# Patient Record
Sex: Male | Born: 2000 | Race: White | Hispanic: No | Marital: Single | Smoking: Never smoker
Health system: Southern US, Community
[De-identification: ages and names within clinical notes are randomized; demographics above are authoritative.]

---
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
## 2020-02-20 ENCOUNTER — Other Ambulatory Visit: Payer: Self-pay | Admitting: Critical Care Medicine

## 2020-02-20 ENCOUNTER — Other Ambulatory Visit: Payer: BC Managed Care – PPO

## 2020-02-20 DIAGNOSIS — Z20822 Contact with and (suspected) exposure to covid-19: Secondary | ICD-10-CM

## 2020-02-21 LAB — SARS-COV-2, NAA 2 DAY TAT

## 2020-02-21 LAB — NOVEL CORONAVIRUS, NAA: SARS-CoV-2, NAA: NOT DETECTED

## 2021-09-21 ENCOUNTER — Other Ambulatory Visit (HOSPITAL_COMMUNITY): Payer: Self-pay | Admitting: Pediatric Gastroenterology

## 2021-09-21 ENCOUNTER — Other Ambulatory Visit: Payer: Self-pay | Admitting: Pediatric Gastroenterology

## 2021-09-21 DIAGNOSIS — K50919 Crohn's disease, unspecified, with unspecified complications: Secondary | ICD-10-CM

## 2021-09-29 ENCOUNTER — Encounter (HOSPITAL_COMMUNITY): Payer: Self-pay

## 2021-09-29 ENCOUNTER — Other Ambulatory Visit: Payer: Self-pay

## 2021-09-29 ENCOUNTER — Ambulatory Visit (HOSPITAL_COMMUNITY)
Admission: RE | Admit: 2021-09-29 | Discharge: 2021-09-29 | Disposition: A | Discharge status: Home / Self Care | Payer: BC Managed Care – PPO | Source: Ambulatory Visit | Attending: Pediatric Gastroenterology | Admitting: Pediatric Gastroenterology

## 2021-09-29 DIAGNOSIS — K50919 Crohn's disease, unspecified, with unspecified complications: Secondary | ICD-10-CM | POA: Diagnosis not present

## 2021-09-29 MED ORDER — BARIUM SULFATE 0.1 % PO SUSP
ORAL | Status: AC
  Filled 2021-09-29: qty 3

## 2021-09-29 MED ORDER — BARIUM SULFATE 0.1 % PO SUSP
450.0000 mL | Freq: Once | ORAL | Status: AC
  Administered 2021-09-29: 450 mL via ORAL

## 2021-09-29 MED ORDER — SODIUM CHLORIDE (PF) 0.9 % IJ SOLN
INTRAMUSCULAR | Status: AC
  Filled 2021-09-29: qty 50

## 2021-09-29 MED ORDER — IOHEXOL 300 MG/ML  SOLN
100.0000 mL | Freq: Once | INTRAMUSCULAR | Status: AC | PRN
  Administered 2021-09-29: 100 mL via INTRAVENOUS

## 2022-09-27 IMAGING — CT CT ENTEROGRAPHY (ABD-PELV W/ CM)
2 of 5 series · 17 of 46 positions shown, 19 images · IV contrast (agent unspecified)
Comparison: None.

CLINICAL DATA: Crohn's disease with complication

EXAM:
CT ABDOMEN AND PELVIS WITH CONTRAST (ENTEROGRAPHY)
TECHNIQUE: Multidetector CT of the abdomen and pelvis during bolus
administration of intravenous contrast. Negative oral contrast was
given.

[Series 3: entero thins · axial · 0.82mm/px · z∈[+1108,+1486]mm · 14 of 213 slices shown, 16 images]
[im 12/213  soft-tissue]
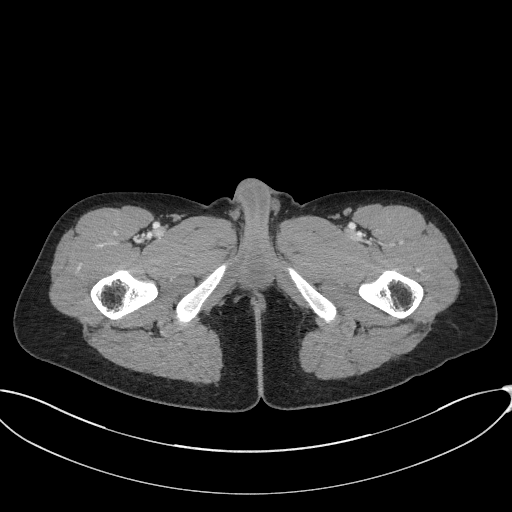
[im 12/213  bone]
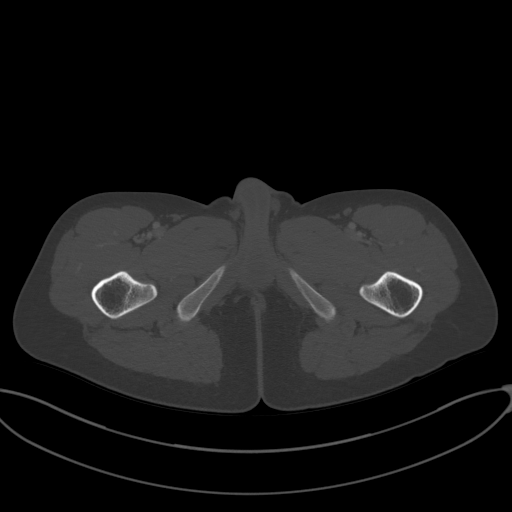
[im 24/213  soft-tissue]
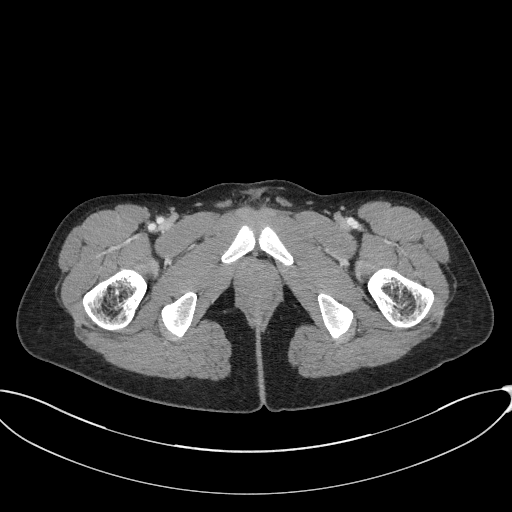
[im 48/213  soft-tissue]
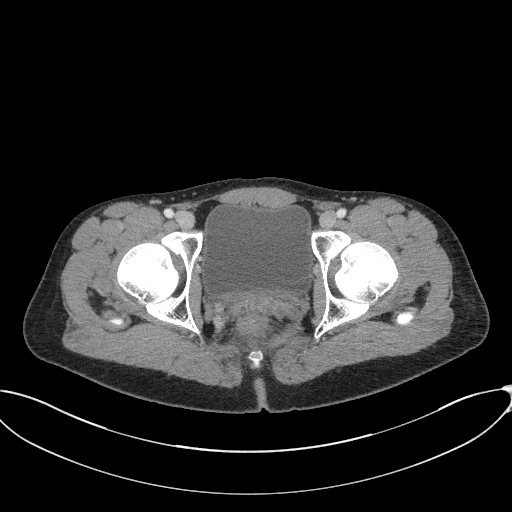
[im 59/213  soft-tissue]
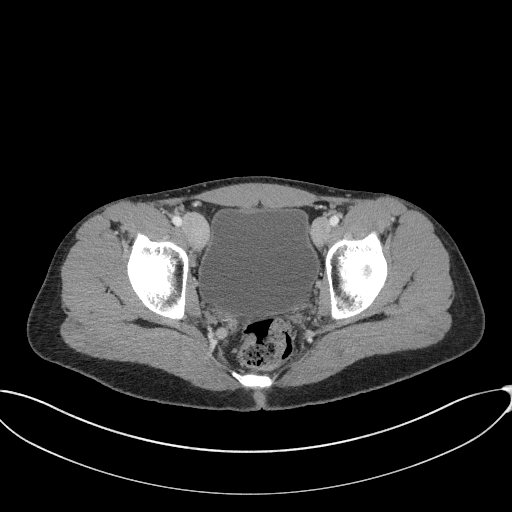
[im 71/213  soft-tissue]
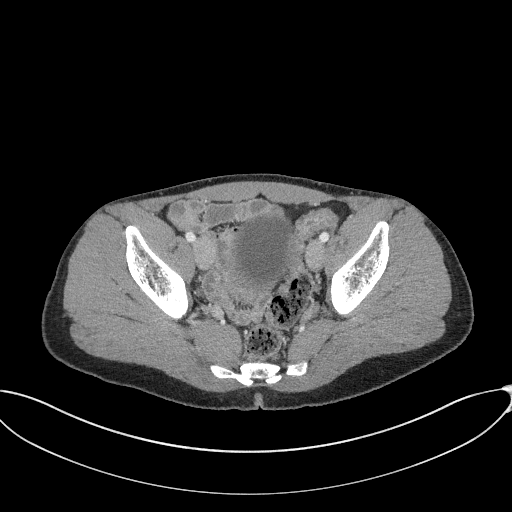
[im 83/213  soft-tissue]
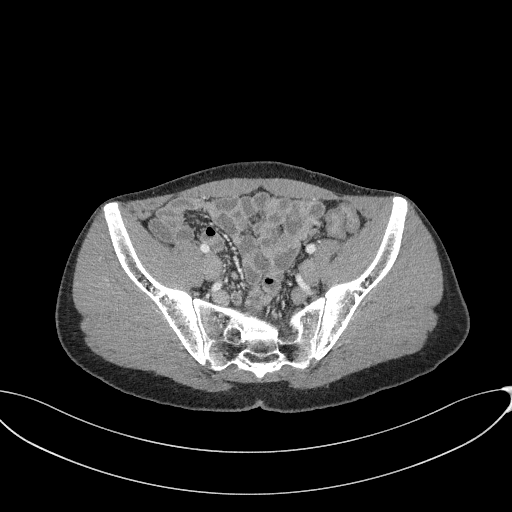
[im 95/213  soft-tissue]
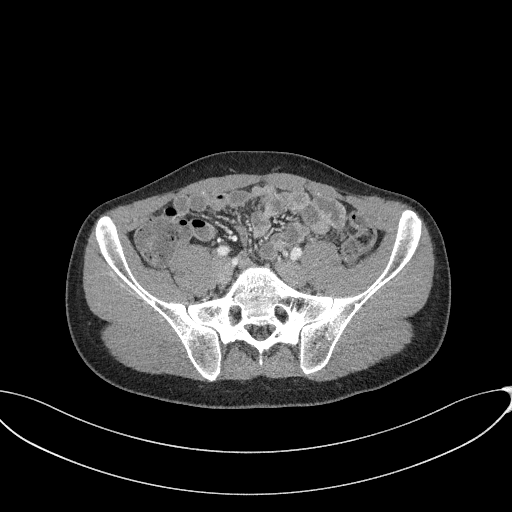
[im 118/213  soft-tissue]
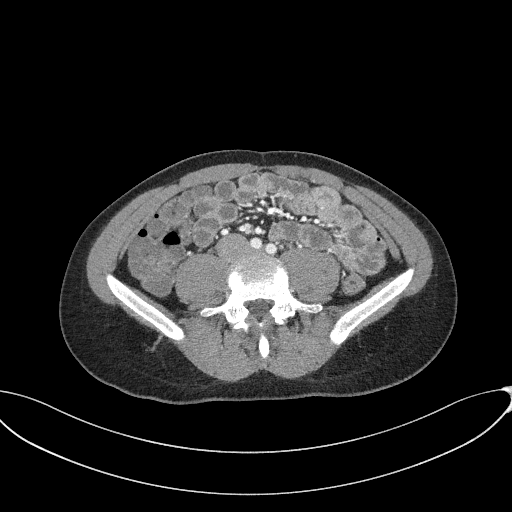
[im 130/213  soft-tissue]
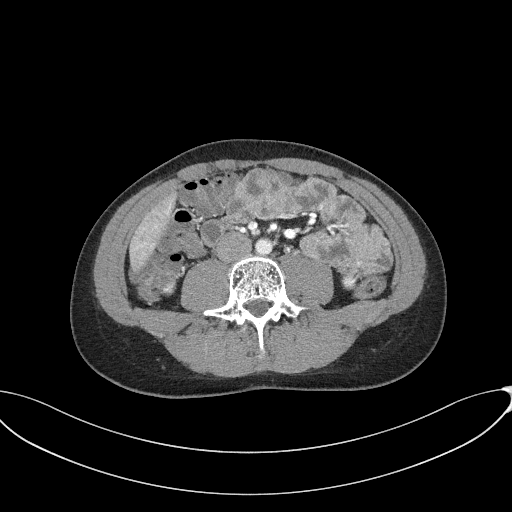
[im 130/213  bone]
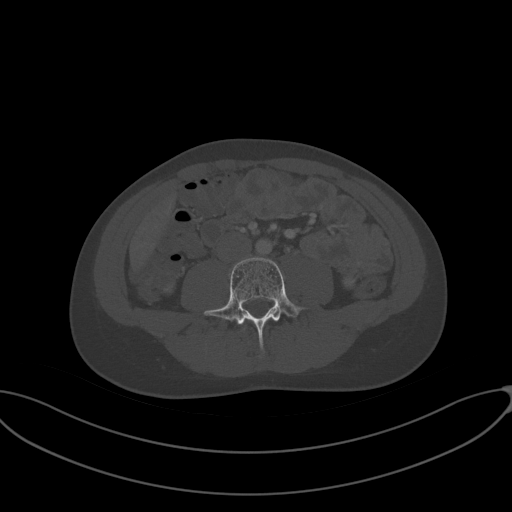
[im 142/213  soft-tissue]
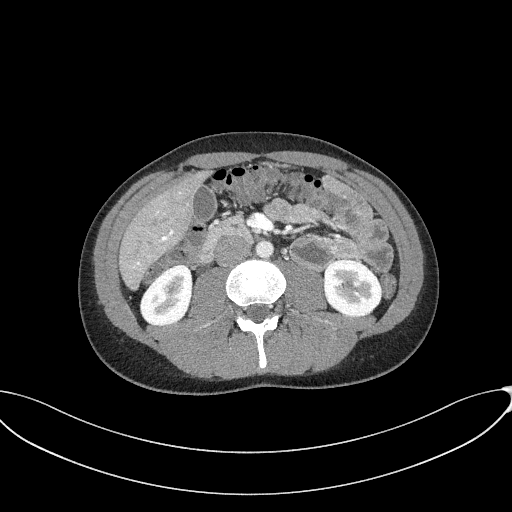
[im 154/213  soft-tissue]
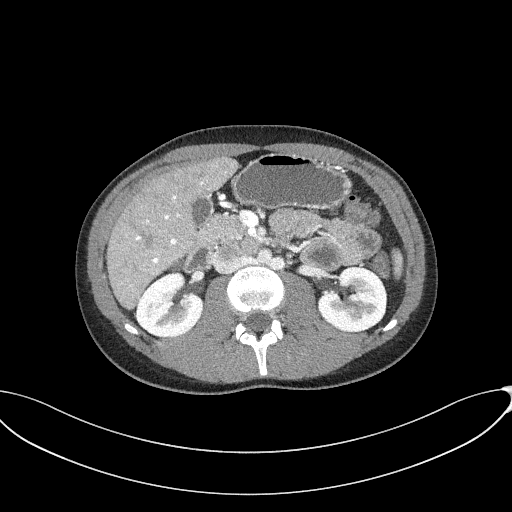
[im 165/213  soft-tissue]
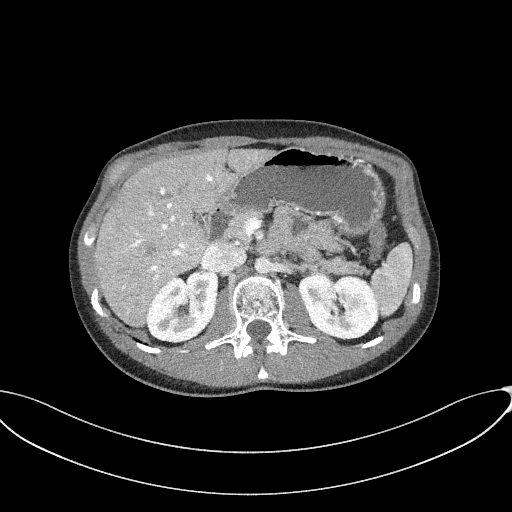
[im 189/213  soft-tissue]
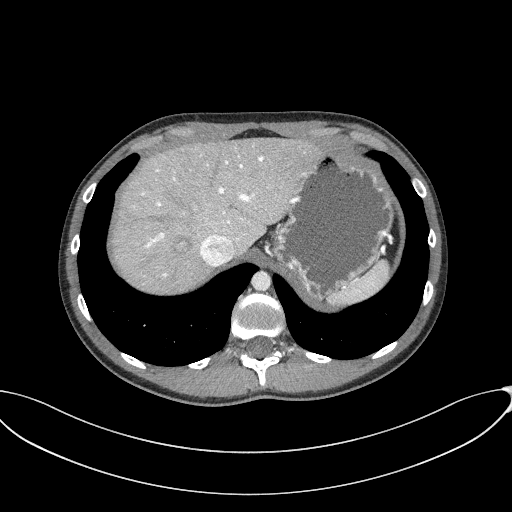
[im 201/213  soft-tissue]
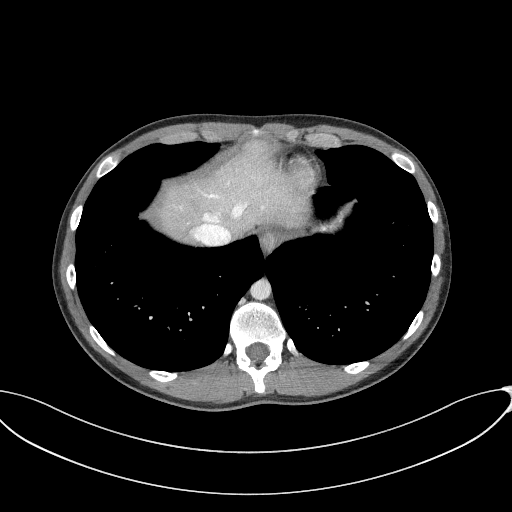

[Series 6: coronal · coronal · 0.80mm/px · 3 of 101 slices shown]
[im 34/101  soft-tissue]
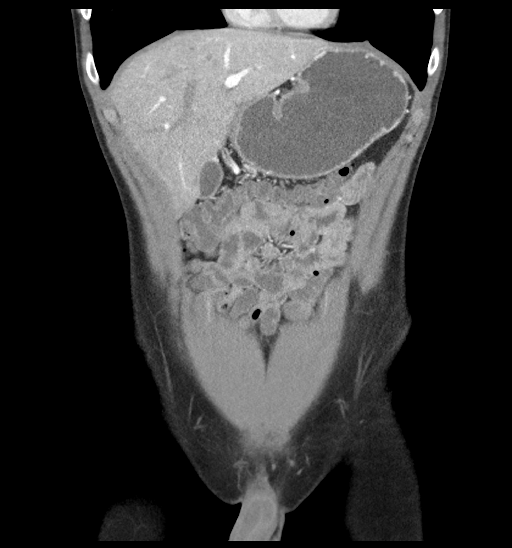
[im 45/101  soft-tissue]
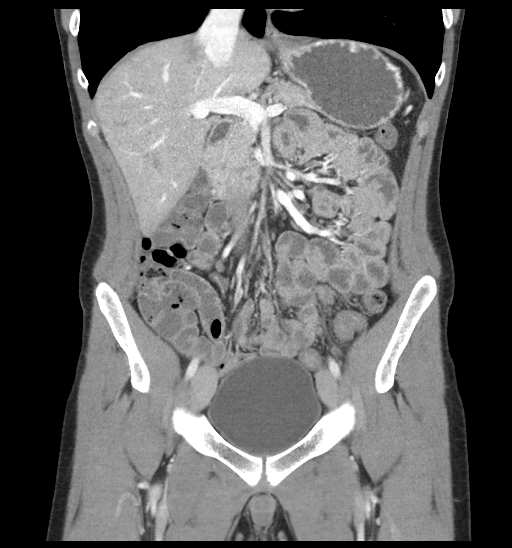
[im 56/101  soft-tissue]
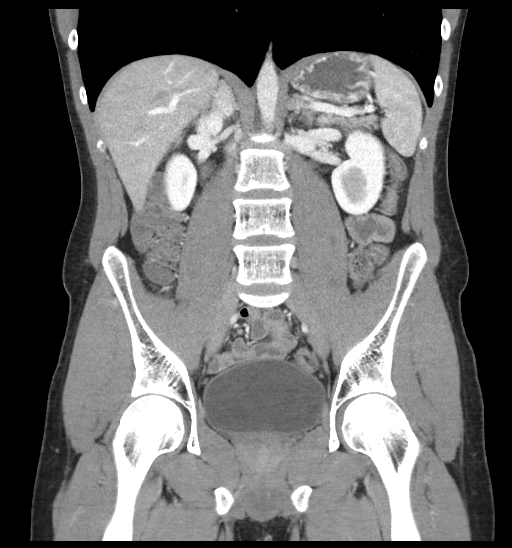

[17 of 46 positions shown; findings below may reference images not displayed]

RADIATION DOSE REDUCTION: This exam was performed according to the
departmental dose-optimization program which includes automated
exposure control, adjustment of the mA and/or kV according to
patient size and/or use of iterative reconstruction technique.

CONTRAST:  100mL OMNIPAQUE IOHEXOL 300 MG/ML  SOLN
FINDINGS: Lower chest:  Unremarkable

Hepatobiliary: Unremarkable

Pancreas: Unremarkable

Spleen: Unremarkable

Adrenals/Urinary Tract: Unremarkable

Stomach/Bowel: Borderline wall thickening in the distal transverse
and descending colon is probably attributable to nondistention
rather than inflammation. No substantial mucosal enhancement in this
region.

The terminal ileum appears unremarkable on image 46 series 6,
without findings of abnormal wall thickening, accentuated mucosal
enhancement, or surrounding inflammatory stranding.

No dilated bowel or specific abnormal segments of the stomach or
bowel identified. Rugal fold enhancement in the stomach is not
accompanied by fold thickening and is thought to likely be
incidental.

Vascular/Lymphatic: Unremarkable

Reproductive: Unremarkable

Other: No supplemental non-categorized findings.

Musculoskeletal: Unremarkable
IMPRESSION: 1. No current findings of active Crohn's disease. No specific
abnormality is identified.
